# Patient Record
Sex: Female | Born: 1945 | Race: White | Hispanic: No | Marital: Single | Smoking: Former smoker
Health system: Southern US, Community
[De-identification: ages and names within clinical notes are randomized; demographics above are authoritative.]

## PROBLEM LIST (undated history)

## (undated) DIAGNOSIS — I10 Essential (primary) hypertension: Secondary | ICD-10-CM

---

## 1997-10-16 ENCOUNTER — Other Ambulatory Visit: Admission: RE | Admit: 1997-10-16 | Discharge: 1997-10-16 | Payer: Self-pay | Admitting: *Deleted

## 1998-12-02 ENCOUNTER — Other Ambulatory Visit: Admission: RE | Admit: 1998-12-02 | Discharge: 1998-12-02 | Payer: Self-pay | Admitting: *Deleted

## 1999-11-19 ENCOUNTER — Other Ambulatory Visit: Admission: RE | Admit: 1999-11-19 | Discharge: 1999-11-19 | Payer: Self-pay | Admitting: *Deleted

## 2000-12-28 ENCOUNTER — Other Ambulatory Visit: Admission: RE | Admit: 2000-12-28 | Discharge: 2000-12-28 | Payer: Self-pay | Admitting: *Deleted

## 2001-09-06 ENCOUNTER — Ambulatory Visit: Admission: RE | Admit: 2001-09-06 | Discharge: 2001-09-06 | Payer: Self-pay | Admitting: Orthopedic Surgery

## 2002-01-02 ENCOUNTER — Other Ambulatory Visit: Admission: RE | Admit: 2002-01-02 | Discharge: 2002-01-02 | Payer: Self-pay | Admitting: *Deleted

## 2003-01-08 ENCOUNTER — Other Ambulatory Visit: Admission: RE | Admit: 2003-01-08 | Discharge: 2003-01-08 | Payer: Self-pay | Admitting: *Deleted

## 2003-06-26 ENCOUNTER — Emergency Department (HOSPITAL_COMMUNITY): Admission: EM | Admit: 2003-06-26 | Discharge: 2003-06-26 | Payer: Self-pay | Admitting: Emergency Medicine

## 2004-01-10 ENCOUNTER — Other Ambulatory Visit: Admission: RE | Admit: 2004-01-10 | Discharge: 2004-01-10 | Payer: Self-pay | Admitting: Family Medicine

## 2005-04-20 ENCOUNTER — Other Ambulatory Visit: Admission: RE | Admit: 2005-04-20 | Discharge: 2005-04-20 | Payer: Self-pay | Admitting: Family Medicine

## 2005-06-02 ENCOUNTER — Emergency Department (HOSPITAL_COMMUNITY): Admission: EM | Admit: 2005-06-02 | Discharge: 2005-06-02 | Payer: Self-pay | Admitting: *Deleted

## 2006-06-10 ENCOUNTER — Other Ambulatory Visit: Admission: RE | Admit: 2006-06-10 | Discharge: 2006-06-10 | Payer: Self-pay | Admitting: Family Medicine

## 2006-06-22 ENCOUNTER — Encounter: Admission: RE | Admit: 2006-06-22 | Discharge: 2006-09-20 | Payer: Self-pay | Admitting: Family Medicine

## 2006-11-01 ENCOUNTER — Encounter: Admission: RE | Admit: 2006-11-01 | Discharge: 2006-11-16 | Payer: Self-pay | Admitting: Family Medicine

## 2009-07-17 ENCOUNTER — Other Ambulatory Visit: Admission: RE | Admit: 2009-07-17 | Discharge: 2009-07-17 | Payer: Self-pay | Admitting: Family Medicine

## 2009-09-18 ENCOUNTER — Ambulatory Visit: Payer: Self-pay | Admitting: Vascular Surgery

## 2009-12-20 ENCOUNTER — Ambulatory Visit: Payer: Self-pay | Admitting: Vascular Surgery

## 2010-02-12 ENCOUNTER — Ambulatory Visit: Payer: Self-pay | Admitting: Vascular Surgery

## 2010-02-19 ENCOUNTER — Ambulatory Visit: Payer: Self-pay | Admitting: Vascular Surgery

## 2010-03-19 ENCOUNTER — Ambulatory Visit: Payer: Self-pay | Admitting: Vascular Surgery

## 2010-03-26 ENCOUNTER — Ambulatory Visit: Payer: Self-pay | Admitting: Vascular Surgery

## 2010-09-23 NOTE — Assessment & Plan Note (Signed)
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  She has done quite well with her right leg as she did with  her left leg several weeks prior.  She does have the usual amount of  bruising and discomfort around the ablation site.  She underwent venous  duplex today and this shows thrombosis of her great saphenous vein from  the insertion site from the level of her knee to the great saphenous  vein and no evidence of DVT.  I am quite pleased with her result as is  Ms. Fuson.  She will notify us if she has difficulty.  Otherwise we  will see her again on an as-needed basis.     Larina Earthly, M.D.  Electronically Signed   TFE/MEDQ  D:  03/26/2010  T:  03/27/2010  Job:  [id number]

## 2010-09-23 NOTE — Assessment & Plan Note (Signed)
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  This was under  local anesthesia in our office without immediate complications.   She was discharged to home and will be seen again in 1 week for duplex.  She will have her stage of right leg following this.     Larina Earthly, M.D.  Electronically Signed   TFE/MEDQ  D:  02/12/2010  T:  02/13/2010  Job:  [id number]

## 2010-09-23 NOTE — Procedures (Signed)
DUPLEX DEEP VENOUS EXAM - LOWER EXTREMITY   INDICATION:  Followup, left greater saphenous vein laser ablation.   HISTORY:  Edema:  No.  Trauma/Surgery:  Left greater saphenous vein laser ablation on  02/12/2010.  Pain:  Left medial thigh tenderness.  PE:  No.  Previous DVT:  No.  Anticoagulants:  Other:   DUPLEX EXAM:                CFV   SFV   PopV  PTV    GSV                R  L  R  L  R  L  R   L  R  L  Thrombosis    o  o     o     o      o     +  Spontaneous   +  +     +     +      +     o  Phasic        +  +     +     +      +     o  Augmentation  +  +     +     +      +     o  Compressible  +  +     +     +      +     o  Competent     o  +     +     +      +     o   Legend:  + - yes  o - no  p - partial  D - decreased    IMPRESSION:  1. Totally occluded left greater saphenous vein extending from the      distal insertion site to the saphenofemoral junction.  2. No deep venous thrombosis noted in the left lower extremity.  3. Reflux noted in the right common femoral vein.  The left      saphenofemoral junction is competent.         _____________________________  Larina Earthly, M.D.   CH/MEDQ  D:  02/20/2010  T:  02/20/2010  Job:  [id number]

## 2010-09-23 NOTE — Procedures (Signed)
DUPLEX DEEP VENOUS EXAM - LOWER EXTREMITY   INDICATION:  Right follow-up ablation.   HISTORY:  Edema:  No.  Trauma/Surgery:  Right GSV ablation.  Pain:  Yes.  PE:  No.  Previous DVT:  No.  Anticoagulants:  no  Other:    DUPLEX EXAM:                CFV   SFV   PopV  PTV    GSV                R  L  R  L  R  L  R   L  R  L  Thrombosis    o  o  o     o     o      +  Spontaneous   +  +  +     +     +      0  Phasic        +  +  +     +     +      0  Augmentation  +  +  +     +     +      0  Compressible  +  +  +     +     +      0  Competent     0  +  +     +     +      +   Legend:  + - yes  o - no  p - partial  D - decreased   IMPRESSION:  No evidence of right lower extremity deep  venous thrombosis. Patent  left CFV without deep venous thrombosis.  Right GSV thrombosed from the SFJ to the distal insertion site .     _____________________________  Larina Earthly, M.D.   EM/MEDQ  D:  03/26/2010  T:  03/26/2010  Job:  [id number]

## 2010-09-23 NOTE — Assessment & Plan Note (Signed)
OFFICE VISIT   CHERYN, LUNDQUIST D  DOB:  Jan 27, 1946                                       12/20/2009  DGUYQ#:[id number]   Wendy Crosby presents today for continued follow-up of her bilateral  venous hypertension.  I had seen her initially in May.  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  She reports that prolonged  sitting especially when driving or car traveling is difficult due to leg  pain.  She has had to have decrease in her excise regimen due to leg  pain and swelling.  She also reports that squatting for gardening and  cleaning house are difficult due to leg pain.  I did re-image her  saphenous veins and she does have saphenous vein enlargement and  incompetence bilaterally feeding into these varicosities.  I feel that  she has failed conservative treatment and have recommended that we  proceed with laser ablation as staged treatment for both legs, laser  ablation and stab phlebectomy.  She does have worsening symptoms on the  left and therefore we will treat her left leg initially followed by her  right.  We will proceed with this at her earliest convenience.     Larina Earthly, M.D.  Electronically Signed   TFE/MEDQ  D:  12/20/2009  T:  12/23/2009  Job:  [id number]

## 2010-09-23 NOTE — Assessment & Plan Note (Signed)
OFFICE VISIT   Wendy Crosby, Wendy Crosby  DOB:  [birth year]                                       03/19/2010  MWNUU#:[id number]   Wendy Crosby presents today for a staged right great saphenous vein  ablation and stab phlebectomy for relief of symptoms of venous  hypertension.  This was done in our office under local anesthesia  without immediate complication.  She will be seen again in 1 week for a  repeat duplex.     Larina Earthly, M.Crosby.  Electronically Signed   TFE/MEDQ  Crosby:  03/19/2010  T:  03/20/2010  Job:  [id number]

## 2010-09-23 NOTE — Consult Note (Signed)
NEW PATIENT CONSULTATION   Wendy Crosby, Wendy Crosby  DOB:  October 18, 1945                                       09/18/2009  OZHYQ#:[id number]   This patient presents today for evaluation of bilateral painful  saphenous vein and tributary varicosities.  She is a 65 year old  otherwise healthy white female who reports progressive changes with  worsening venous varicosities since she was in her mid 47s.  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  She reports this has  been progressive over the course of many years.  She reports that it  makes long trips difficult.  She does have marked swelling with  prolonged standing in both legs from her knees distally.  She does not  have any history of DVT, superficial thrombophlebitis or bleeding.   PAST HISTORY:  Significant for hyperlipidemia, osteoporosis,  diverticulosis, and migraine headaches.  She has no significant cardiac  disease.   FAMILY HISTORY:  Markedly positive for varicose veins in her father and  paternal grandmother.  They had both had saphenous vein stripping many  years ago.   SOCIAL HISTORY:  She is single.  She is a semi-retired Engineer, site.  She quit smoking in 2006.  Does not drink alcohol.   REVIEW OF SYSTEMS:  Negative for weight loss or weight gain.  Her weight  is currently 150 pounds.  She is 5 feet 4 inches tall.  CARDIAC, PULMONARY, GI, GU:  Negative.  VASCULAR:  Positive only for pain with prolonged standing.  NEUROLOGIC:  Positive for headache.  MUSCULOSKELETAL:  Positive arthritis.  PSYCHIATRIC, ENT, HEMATOLOGIC, SKIN:  Negative.   PHYSICAL EXAMINATION:  A well-developed, well-nourished white female  appearing stated age of 80, in no acute distress.  Blood pressure 113/82  pulse 93, respirations 20.  HEENT:  Normal.  Chest;  Clear bilaterally.  Abdomen:  Soft with no masses.  Her radial and dorsalis  pedis pulses are  2+ bilaterally.  Musculoskeletal:  Shows no major deformities or  cyanosis.  Neurologic:  No focal weakness or paresthesias.  Skin:  Without ulcer or rashes.  She does have marked tributary varicosities in  both medial calves.  She has no venous ulcers.   She underwent noninvasive vascular laboratory studies in our office  today.  This showed no evidence of deep venous occlusion or reflux.  She  has marked reflux throughout her great saphenous vein bilaterally.  The  saphenous veins are not aneurysmal or tortuous.  Her small saphenous  veins are bilaterally.   I had a long discussion with the patient explaining the significance of  this.  She does have symptoms related to her venous hypertension.  She  does elevate her legs when possible and has worn support hose but has  not worn graduated compression garments.  We have fitted her with these  today, 20-30 mmHg, thigh-high compression garments.  We instructed her  on the use of these and will see her again in 3 months to determine if  she is having effective treatment.  I did discuss the option of staged  bilateral great saphenous vein laser ablation and stab phlebectomy of  her tributary varicosities if she fails conservative treatment.  We will  discuss this further with her in 3 months.  Larina Earthly, M.Crosby.  Electronically Signed   TFE/MEDQ  Crosby:  09/18/2009  T:  09/19/2009  Job:  [id number]   cc:   Sigmund Hazel, M.Crosby.

## 2010-09-23 NOTE — Procedures (Signed)
LOWER EXTREMITY VENOUS REFLUX EXAM   INDICATION:  Varicose veins, bilateral raised and painful.   EXAM:  Using color-flow imaging and pulse Doppler spectral analysis, the  right and left common femoral, superficial femoral, popliteal, posterior  tibial, greater and lesser saphenous veins are evaluated.  There is no  evidence suggesting deep venous insufficiency in the right and left  lower extremities.   The right and left saphenofemoral junction is not competent with Reflux  of >513milliseconds. The right and left GSV is not competent with Reflux  of >523milliseconds with the caliber as described below.   The right and left proximal short saphenous veins demonstrates  competency.   GSV Diameter (used if found to be incompetent only)                                            Right    Left  Proximal Greater Saphenous Vein           0.79 cm  0.86 cm  Proximal-to-mid-thigh                     0.56 cm  1.11 cm  Mid thigh                                 0.54 cm  1.06 cm  Mid-distal thigh                          0.60 cm  0.64 cm  Distal thigh                              0.54 cm  0.66 cm  Knee                                      0.55 cm  0.71 cm   IMPRESSION:  1. The right and left greater saphenous vein Reflux with      >517milliseconds is identified with the caliber ranging from on the      right 0.79 cm to 0.54 cm knee to groin.  On the left, 1.11 to 0.64      cm knee to groin.  2. The right and left greater saphenous veins are not aneurysmal.  3. The right and left greater saphenous veins are not tortuous.  4. The deep venous system is competent.  5. The right and left lesser saphenous veins are competent.         ___________________________________________  Larina Earthly, M.D.   NT/MEDQ  D:  09/18/2009  T:  09/18/2009  Job:  [id number]

## 2010-09-23 NOTE — Assessment & Plan Note (Signed)
OFFICE VISIT   SALSABEEL, GORELICK  DOB:  03-21-1946                                       02/19/2010  ZOXWR#:[id number]   The patient is here today for follow-up of her left leg great saphenous  vein ablation, stab phlebectomy, multiple tributary varicosities in her  left calf.  She did extremely well with minimal discomfort and minimal  bruising and has returned to her usual activities.  She has been very  compliant with her compression.  She underwent a repeat venous duplex  today.  This reveals ablation of her left great saphenous vein and no  injury of her common femoral vein.  I am quite pleased with her results.  We will see her again on November 9 where she will undergo similar  treatment in her right leg.     Larina Earthly, M.D.  Electronically Signed   TFE/MEDQ  D:  02/19/2010  T:  02/20/2010  Job:  [id number]   cc:   Sigmund Hazel, M.D.

## 2012-01-12 ENCOUNTER — Encounter (HOSPITAL_BASED_OUTPATIENT_CLINIC_OR_DEPARTMENT_OTHER): Payer: Medicare Other | Attending: General Surgery

## 2012-01-12 DIAGNOSIS — T22359A Burn of third degree of unspecified shoulder, initial encounter: Secondary | ICD-10-CM | POA: Insufficient documentation

## 2012-01-12 DIAGNOSIS — X31XXXA Exposure to excessive natural cold, initial encounter: Secondary | ICD-10-CM | POA: Insufficient documentation

## 2012-01-12 DIAGNOSIS — T3390XA Superficial frostbite of unspecified sites, initial encounter: Secondary | ICD-10-CM | POA: Insufficient documentation

## 2012-01-12 NOTE — H&P (Signed)
NAMEJENSEN, Wendy Crosby NO.:  [id number]  MEDICAL RECORD NO.:  [id number]  LOCATION:  FOOT                         FACILITY:  [facility]  PHYSICIAN:  Joanne Gavel, M.D.        DATE OF BIRTH:  14-May-1945  DATE OF ADMISSION:  01/12/2012 DATE OF DISCHARGE:                             HISTORY & PHYSICAL   CHIEF COMPLAINT:  Wound, left shoulder.  HISTORY OF PRESENT ILLNESS:  The patient has a rotator cuff abnormality with a great deal of left shoulder pain.  She was icing her left shoulder approximately 3 weeks ago and after icing, she noticed that she had a wound on her shoulder which eventuated in to a black patch.  PAST MEDICAL HISTORY:  Significant for hyperlipidemia, diverticulosis, osteopenia, varicose veins and hypertension.  SURGICAL HISTORY:  Saphenous vein excision and cataract surgery.  Cigarettes none.  Alcohol occasionally.  ALLERGIES:  None.  MEDICATIONS:  Hydrochlorothiazide, simvastatin, Mobic, Imitrex, and Systane.  REVIEW OF SYSTEMS:  Essentially as above.  PHYSICAL EXAMINATION:  VITAL SIGNS:  Temperature 98, pulse 96, respirations 16, blood pressure 130/88. GENERAL APPEARANCE:  Well developed, well nourished, no distress. HEENT:  Eyes, ears, nose and throat negative. SKIN:  On the left shoulder, there is a 2.5 x 3.5 full-thickness eschar. This is black and quite adherent. ABDOMEN:  Not examined. EXTREMITIES:  Not examined.  There is normal symmetrical strength in the upper extremities.  IMPRESSION:  Frostbite injury, left shoulder.  An attempt of excision was made, but the eschar is too adherent.  In the meantime we will treat the patient with Santyl and Hydrogel and we will see her in 7 days.     Joanne Gavel, M.D.     RA/MEDQ  D:  01/12/2012  T:  01/12/2012  Job:  [id number]  cc:   Dr. Hyacinth Meeker

## 2012-02-09 ENCOUNTER — Encounter (HOSPITAL_BASED_OUTPATIENT_CLINIC_OR_DEPARTMENT_OTHER): Payer: Medicare Other | Attending: General Surgery

## 2012-02-09 DIAGNOSIS — X31XXXA Exposure to excessive natural cold, initial encounter: Secondary | ICD-10-CM | POA: Insufficient documentation

## 2012-02-09 DIAGNOSIS — T22359A Burn of third degree of unspecified shoulder, initial encounter: Secondary | ICD-10-CM | POA: Insufficient documentation

## 2012-02-09 DIAGNOSIS — T3390XA Superficial frostbite of unspecified sites, initial encounter: Secondary | ICD-10-CM | POA: Insufficient documentation

## 2012-03-15 ENCOUNTER — Encounter (HOSPITAL_BASED_OUTPATIENT_CLINIC_OR_DEPARTMENT_OTHER): Payer: Medicare Other | Attending: General Surgery

## 2012-03-15 DIAGNOSIS — T3390XA Superficial frostbite of unspecified sites, initial encounter: Secondary | ICD-10-CM | POA: Insufficient documentation

## 2012-03-15 DIAGNOSIS — X31XXXA Exposure to excessive natural cold, initial encounter: Secondary | ICD-10-CM | POA: Insufficient documentation

## 2012-04-12 ENCOUNTER — Encounter (HOSPITAL_BASED_OUTPATIENT_CLINIC_OR_DEPARTMENT_OTHER): Payer: Medicare Other

## 2012-04-12 ENCOUNTER — Encounter (HOSPITAL_BASED_OUTPATIENT_CLINIC_OR_DEPARTMENT_OTHER): Payer: Medicare Other | Attending: General Surgery

## 2012-04-12 DIAGNOSIS — T22359A Burn of third degree of unspecified shoulder, initial encounter: Secondary | ICD-10-CM | POA: Insufficient documentation

## 2012-04-12 DIAGNOSIS — T310 Burns involving less than 10% of body surface: Secondary | ICD-10-CM | POA: Insufficient documentation

## 2012-04-12 DIAGNOSIS — X19XXXA Contact with other heat and hot substances, initial encounter: Secondary | ICD-10-CM | POA: Insufficient documentation

## 2012-07-25 ENCOUNTER — Other Ambulatory Visit: Payer: Self-pay | Admitting: Family Medicine

## 2012-07-25 DIAGNOSIS — R140 Abdominal distension (gaseous): Secondary | ICD-10-CM

## 2012-07-28 ENCOUNTER — Ambulatory Visit
Admission: RE | Admit: 2012-07-28 | Discharge: 2012-07-28 | Disposition: A | Discharge status: Home / Self Care | Payer: Medicare Other | Source: Ambulatory Visit | Attending: Family Medicine | Admitting: Family Medicine

## 2012-07-28 DIAGNOSIS — R140 Abdominal distension (gaseous): Secondary | ICD-10-CM

## 2012-12-08 ENCOUNTER — Other Ambulatory Visit: Payer: Self-pay | Admitting: Family Medicine

## 2013-01-10 ENCOUNTER — Other Ambulatory Visit: Payer: Self-pay | Admitting: Family Medicine

## 2013-01-10 DIAGNOSIS — Z0000 Encounter for general adult medical examination without abnormal findings: Secondary | ICD-10-CM

## 2013-01-12 ENCOUNTER — Ambulatory Visit
Admission: RE | Admit: 2013-01-12 | Discharge: 2013-01-12 | Disposition: A | Discharge status: Home / Self Care | Payer: Medicare Other | Source: Ambulatory Visit | Attending: Family Medicine | Admitting: Family Medicine

## 2013-01-12 DIAGNOSIS — Z0000 Encounter for general adult medical examination without abnormal findings: Secondary | ICD-10-CM

## 2014-01-26 IMAGING — US US TRANSVAGINAL NON-OB
1 series · 13 of 25 positions shown · non-contrast
Comparison: 07/28/2012

CLINICAL DATA: Follow-up left ovarian dermoid.  LMP 1441



[Series 1: us transvaginal non-ob · 0.24mm/px · 13 of 51 slices shown]
[im 1/51]
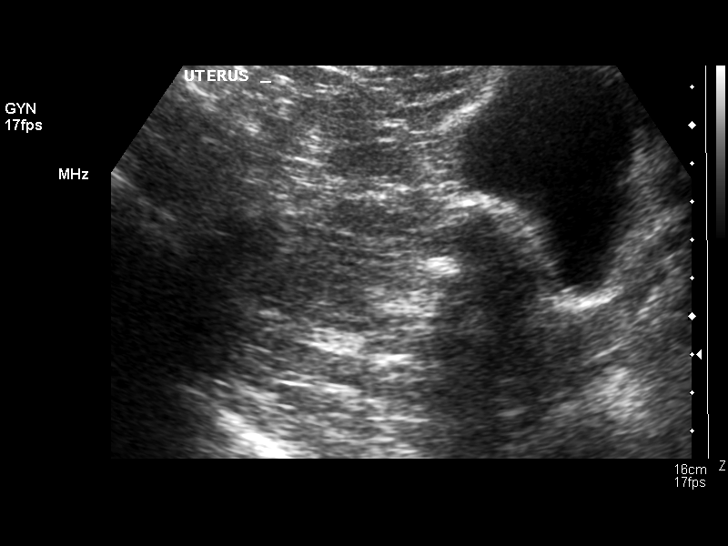
[im 5/51]
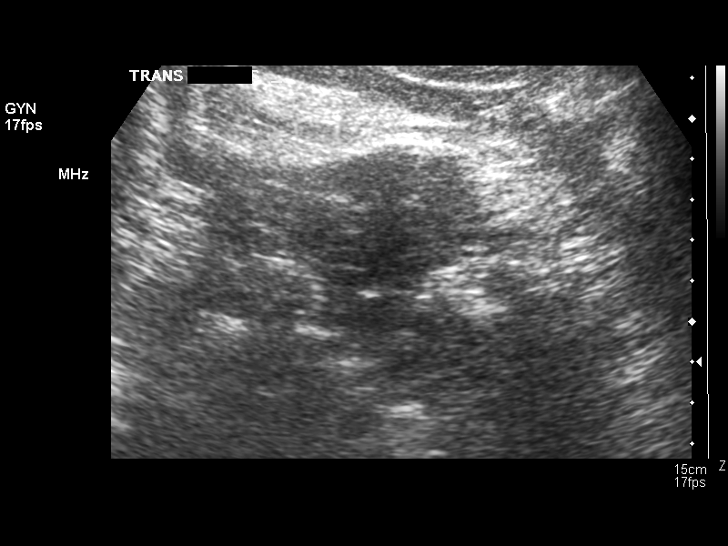
[im 9/51]
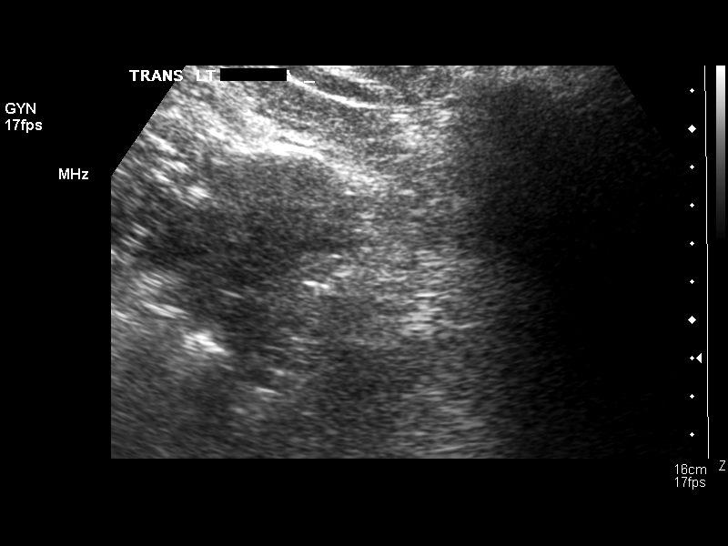
[im 13/51]
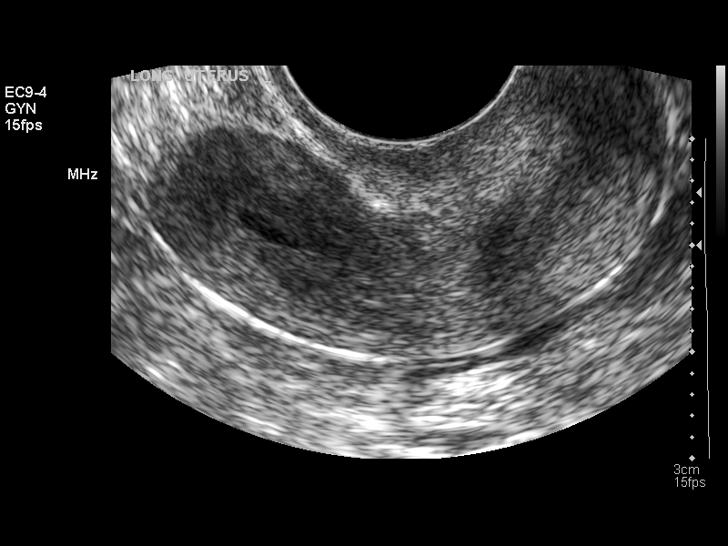
[im 17/51]
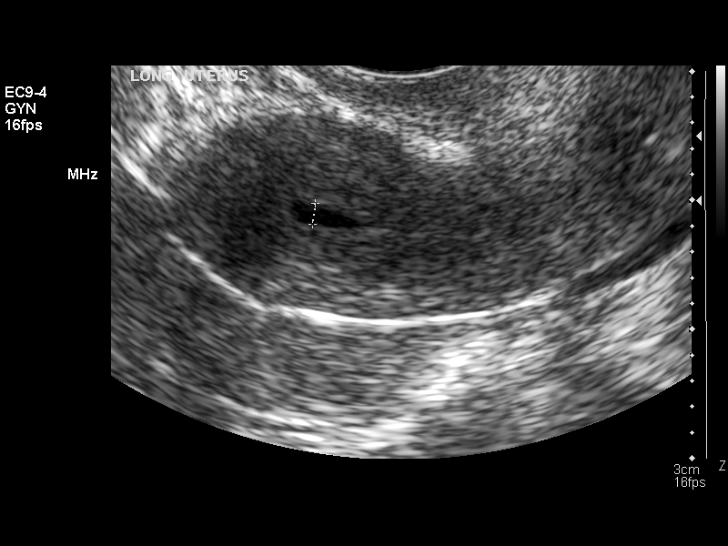
[im 21/51]
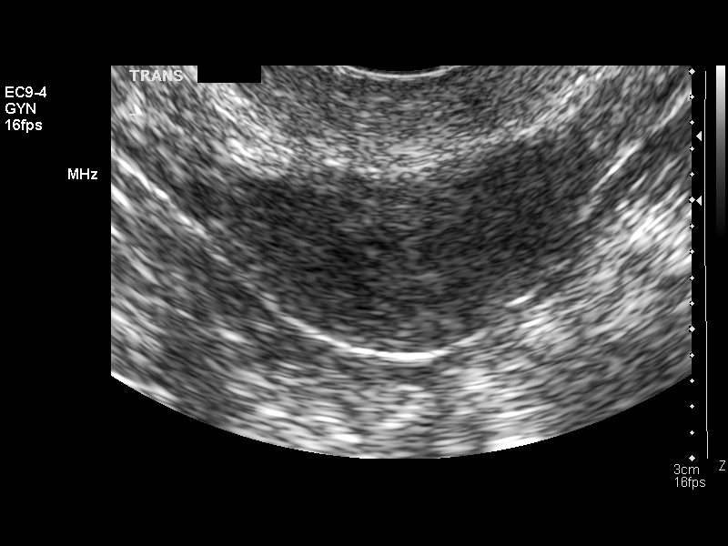
[im 26/51]
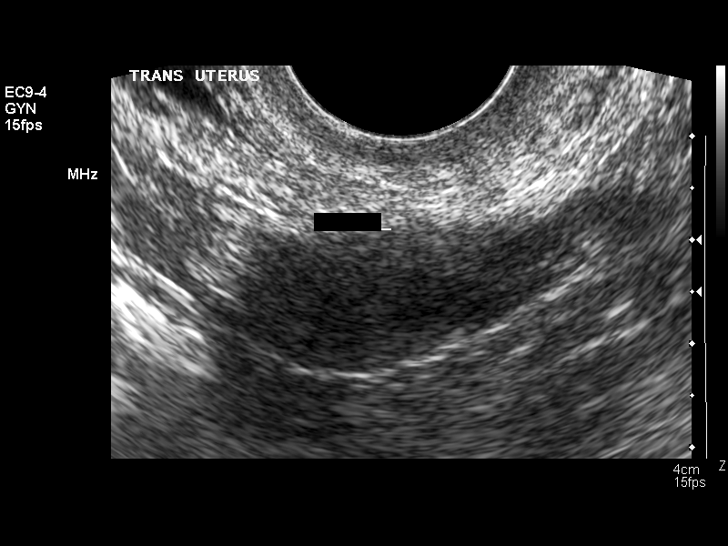
[im 30/51]
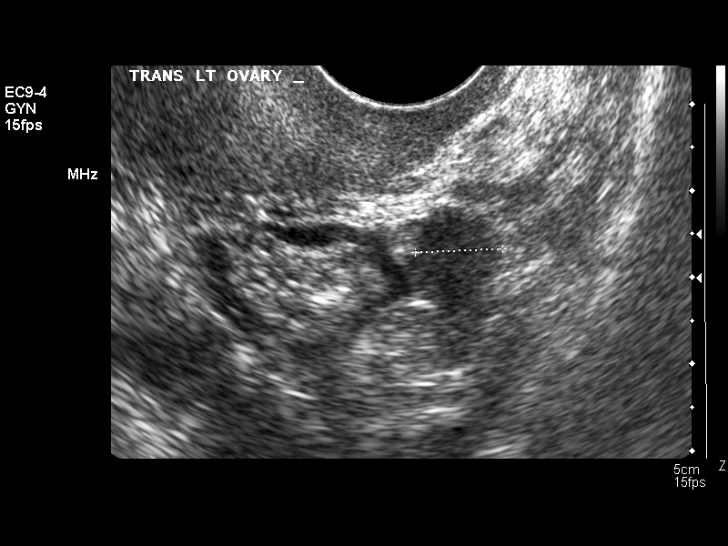
[im 34/51]
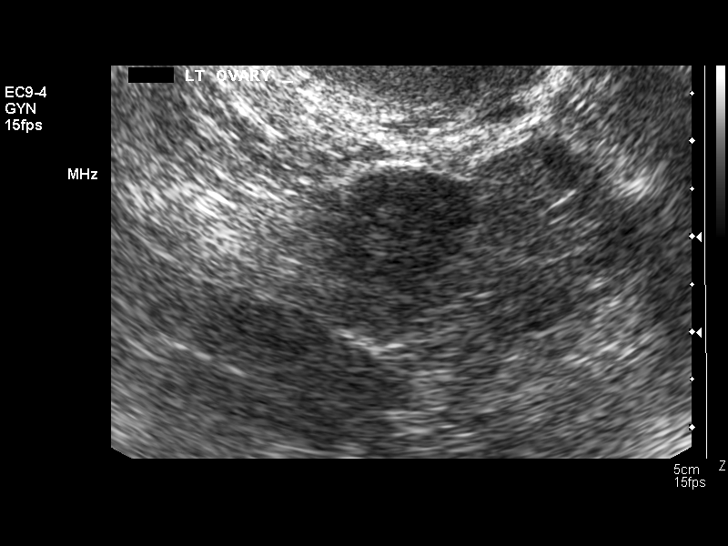
[im 38/51]
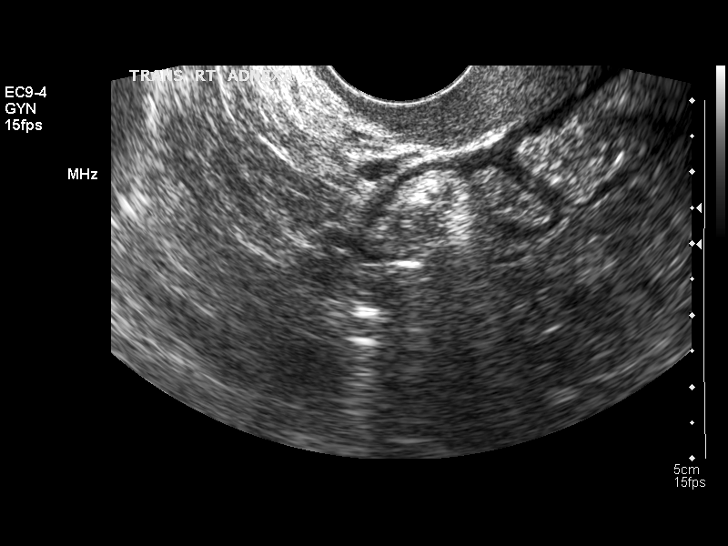
[im 42/51]
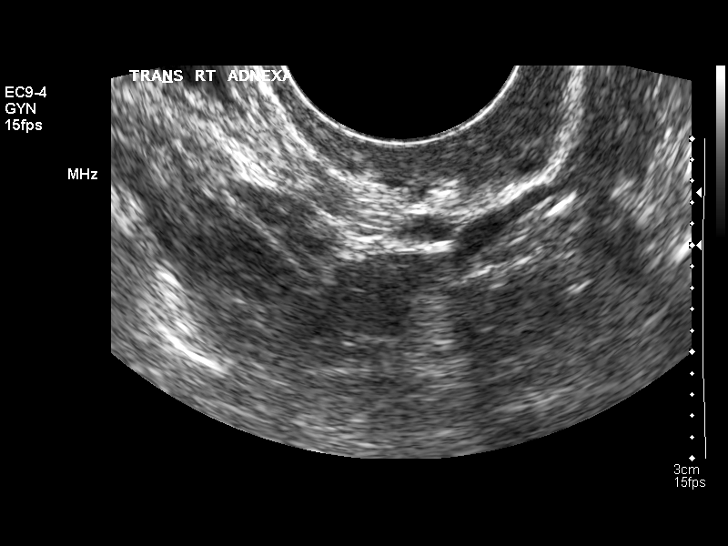
[im 46/51]
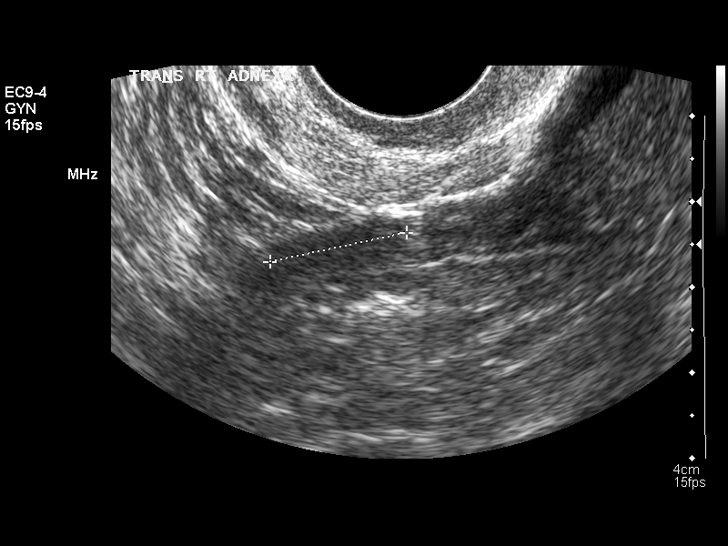
[im 51/51]
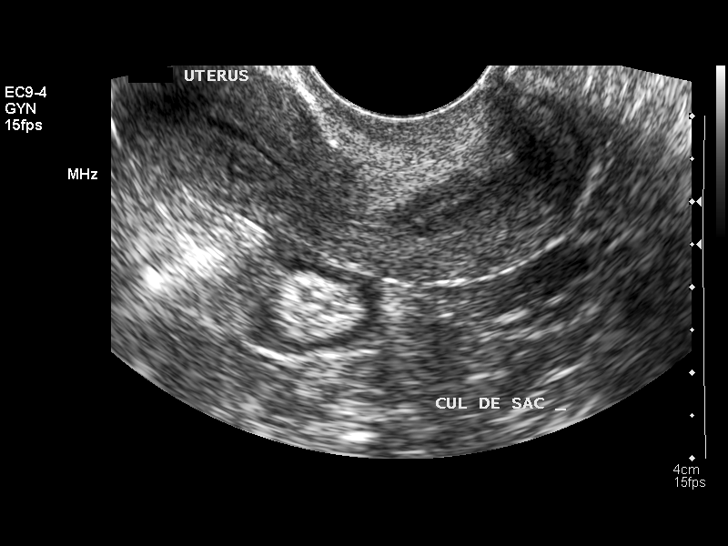

[13 of 25 positions shown; findings below may reference images not displayed]

FINDINGS: Uterus: Is anteverted and anteflexed and demonstrates a sagittal
length of 5.7 cm, depth of 1.6 cm and width of 3.4 cm.  No focal
mural abnormality is seen

Endometrium: A small amount of fluid is noted in the fundal portion
of the endometrium and within acceptable limits in a postmenopausal
patient.  This outlines a thin endometrium with a single layer
thickness of 8 mm and no areas of focal thickening or
heterogeneity.

Right ovary:  Has a normal appearance measuring 1.4 x 0.8 by
cm.  The previously noted benign appearing simple cyst is no longer
visualized compatible with interval resolution of a functional cyst

Left ovary: Measures 1.5 x 1.5 x 1.0 cm. The small area of
increased echogenicity in the left ovary seen on the previous exam
is no longer visualized and may have been artifactual or represents
a resolved benign process.  No further follow-up for this finding
is needed

Other findings: No pelvic fluid or separate adnexal masses are
identified.
IMPRESSION: Resolved right ovarian simple cyst and small left ovarian echogenic
focus compatible with prior benign findings.  No further follow-up
for these findings is needed.

Unremarkable uterine myometrium and endometrium..

## 2015-09-05 ENCOUNTER — Other Ambulatory Visit: Payer: Self-pay | Admitting: Gastroenterology

## 2019-06-03 ENCOUNTER — Ambulatory Visit: Payer: Medicare PPO | Attending: Internal Medicine

## 2019-06-03 DIAGNOSIS — Z23 Encounter for immunization: Secondary | ICD-10-CM | POA: Insufficient documentation

## 2019-06-03 NOTE — Progress Notes (Signed)
   Covid-19 Vaccination Clinic  Name:  [facility]    MRN: [id number] DOB: 11/04/1945  06/03/2019  Ms. Rotenberry was observed post Covid-19 immunization for 15 minutes without incidence. She was provided with Vaccine Information Sheet and instruction to access the V-Safe system.   Ms. Skolnik was instructed to call 911 with any severe reactions post vaccine: Marland Kitchen Difficulty breathing  . Swelling of your face and throat  . A fast heartbeat  . A bad rash all over your body  . Dizziness and weakness    Immunizations Administered    Name Date Dose VIS Date Route   Pfizer COVID-19 Vaccine 06/03/2019  2:26 PM 0.3 mL 04/21/2019 Intramuscular   Manufacturer: [employer], [employer]   Lot: [id number]   NDC: [address]-2

## 2019-06-07 ENCOUNTER — Ambulatory Visit: Payer: Medicare PPO

## 2019-06-09 ENCOUNTER — Ambulatory Visit: Payer: Medicare PPO

## 2019-06-24 ENCOUNTER — Ambulatory Visit: Payer: Medicare PPO

## 2019-06-25 ENCOUNTER — Ambulatory Visit: Payer: Medicare PPO

## 2019-06-25 ENCOUNTER — Ambulatory Visit: Payer: Medicare PPO | Attending: Internal Medicine

## 2019-06-25 DIAGNOSIS — Z23 Encounter for immunization: Secondary | ICD-10-CM

## 2019-06-25 NOTE — Progress Notes (Signed)
   Covid-19 Vaccination Clinic  Name:  Wendy Crosby    MRN: [id number] DOB: 1945/11/14  06/25/2019  Wendy Crosby was observed post Covid-19 immunization for 15 minutes without incidence. She was provided with Vaccine Information Sheet and instruction to access the V-Safe system.   Wendy Crosby was instructed to call 911 with any severe reactions post vaccine: Marland Kitchen Difficulty breathing  . Swelling of your face and throat  . A fast heartbeat  . A bad rash all over your body  . Dizziness and weakness    Immunizations Administered    Name Date Dose VIS Date Route   Pfizer COVID-19 Vaccine 06/25/2019 12:44 PM 0.3 mL 04/21/2019 Intramuscular   Manufacturer: [employer], [employer]   Lot: [id number]   NDC: [address]-4

## 2019-07-09 ENCOUNTER — Ambulatory Visit: Payer: Self-pay

## 2019-08-23 DIAGNOSIS — L719 Rosacea, unspecified: Secondary | ICD-10-CM | POA: Diagnosis not present

## 2019-08-23 DIAGNOSIS — E7800 Pure hypercholesterolemia, unspecified: Secondary | ICD-10-CM | POA: Diagnosis not present

## 2019-08-23 DIAGNOSIS — Z0000 Encounter for general adult medical examination without abnormal findings: Secondary | ICD-10-CM | POA: Diagnosis not present

## 2019-08-23 DIAGNOSIS — G43009 Migraine without aura, not intractable, without status migrainosus: Secondary | ICD-10-CM | POA: Diagnosis not present

## 2019-08-23 DIAGNOSIS — M533 Sacrococcygeal disorders, not elsewhere classified: Secondary | ICD-10-CM | POA: Diagnosis not present

## 2019-08-23 DIAGNOSIS — Z79899 Other long term (current) drug therapy: Secondary | ICD-10-CM | POA: Diagnosis not present

## 2019-08-23 DIAGNOSIS — Z6825 Body mass index (BMI) 25.0-25.9, adult: Secondary | ICD-10-CM | POA: Diagnosis not present

## 2019-08-23 DIAGNOSIS — B351 Tinea unguium: Secondary | ICD-10-CM | POA: Diagnosis not present

## 2019-10-11 DIAGNOSIS — L814 Other melanin hyperpigmentation: Secondary | ICD-10-CM | POA: Diagnosis not present

## 2019-10-11 DIAGNOSIS — Z86018 Personal history of other benign neoplasm: Secondary | ICD-10-CM | POA: Diagnosis not present

## 2019-10-11 DIAGNOSIS — L570 Actinic keratosis: Secondary | ICD-10-CM | POA: Diagnosis not present

## 2019-10-11 DIAGNOSIS — L578 Other skin changes due to chronic exposure to nonionizing radiation: Secondary | ICD-10-CM | POA: Diagnosis not present

## 2019-10-11 DIAGNOSIS — L905 Scar conditions and fibrosis of skin: Secondary | ICD-10-CM | POA: Diagnosis not present

## 2019-10-11 DIAGNOSIS — L821 Other seborrheic keratosis: Secondary | ICD-10-CM | POA: Diagnosis not present

## 2019-10-11 DIAGNOSIS — D225 Melanocytic nevi of trunk: Secondary | ICD-10-CM | POA: Diagnosis not present

## 2019-10-24 DIAGNOSIS — Z961 Presence of intraocular lens: Secondary | ICD-10-CM | POA: Diagnosis not present

## 2019-12-29 DIAGNOSIS — Z1231 Encounter for screening mammogram for malignant neoplasm of breast: Secondary | ICD-10-CM | POA: Diagnosis not present

## 2020-01-19 DIAGNOSIS — E7800 Pure hypercholesterolemia, unspecified: Secondary | ICD-10-CM | POA: Diagnosis not present

## 2020-08-26 DIAGNOSIS — Z23 Encounter for immunization: Secondary | ICD-10-CM | POA: Diagnosis not present

## 2020-08-26 DIAGNOSIS — Z0000 Encounter for general adult medical examination without abnormal findings: Secondary | ICD-10-CM | POA: Diagnosis not present

## 2020-08-26 DIAGNOSIS — Z1389 Encounter for screening for other disorder: Secondary | ICD-10-CM | POA: Diagnosis not present

## 2020-10-10 DIAGNOSIS — L578 Other skin changes due to chronic exposure to nonionizing radiation: Secondary | ICD-10-CM | POA: Diagnosis not present

## 2020-10-10 DIAGNOSIS — D225 Melanocytic nevi of trunk: Secondary | ICD-10-CM | POA: Diagnosis not present

## 2020-10-10 DIAGNOSIS — Z86018 Personal history of other benign neoplasm: Secondary | ICD-10-CM | POA: Diagnosis not present

## 2020-10-10 DIAGNOSIS — L905 Scar conditions and fibrosis of skin: Secondary | ICD-10-CM | POA: Diagnosis not present

## 2020-10-10 DIAGNOSIS — L821 Other seborrheic keratosis: Secondary | ICD-10-CM | POA: Diagnosis not present

## 2020-10-10 DIAGNOSIS — L814 Other melanin hyperpigmentation: Secondary | ICD-10-CM | POA: Diagnosis not present

## 2020-10-10 DIAGNOSIS — L570 Actinic keratosis: Secondary | ICD-10-CM | POA: Diagnosis not present

## 2020-10-30 DIAGNOSIS — G43009 Migraine without aura, not intractable, without status migrainosus: Secondary | ICD-10-CM | POA: Diagnosis not present

## 2020-10-30 DIAGNOSIS — L719 Rosacea, unspecified: Secondary | ICD-10-CM | POA: Diagnosis not present

## 2020-10-30 DIAGNOSIS — Z79899 Other long term (current) drug therapy: Secondary | ICD-10-CM | POA: Diagnosis not present

## 2020-10-30 DIAGNOSIS — E7800 Pure hypercholesterolemia, unspecified: Secondary | ICD-10-CM | POA: Diagnosis not present

## 2020-10-30 DIAGNOSIS — M859 Disorder of bone density and structure, unspecified: Secondary | ICD-10-CM | POA: Diagnosis not present

## 2020-11-01 DIAGNOSIS — H4720 Unspecified optic atrophy: Secondary | ICD-10-CM | POA: Diagnosis not present

## 2020-11-01 DIAGNOSIS — Z961 Presence of intraocular lens: Secondary | ICD-10-CM | POA: Diagnosis not present

## 2020-11-01 DIAGNOSIS — H524 Presbyopia: Secondary | ICD-10-CM | POA: Diagnosis not present

## 2020-12-30 DIAGNOSIS — Z1231 Encounter for screening mammogram for malignant neoplasm of breast: Secondary | ICD-10-CM | POA: Diagnosis not present

## 2020-12-30 DIAGNOSIS — M810 Age-related osteoporosis without current pathological fracture: Secondary | ICD-10-CM | POA: Diagnosis not present

## 2020-12-30 DIAGNOSIS — Z780 Asymptomatic menopausal state: Secondary | ICD-10-CM | POA: Diagnosis not present

## 2020-12-30 DIAGNOSIS — M8589 Other specified disorders of bone density and structure, multiple sites: Secondary | ICD-10-CM | POA: Diagnosis not present

## 2021-09-02 DIAGNOSIS — Z0000 Encounter for general adult medical examination without abnormal findings: Secondary | ICD-10-CM | POA: Diagnosis not present

## 2021-09-02 DIAGNOSIS — Z1389 Encounter for screening for other disorder: Secondary | ICD-10-CM | POA: Diagnosis not present

## 2021-09-05 DIAGNOSIS — I10 Essential (primary) hypertension: Secondary | ICD-10-CM | POA: Diagnosis not present

## 2021-10-14 DIAGNOSIS — L821 Other seborrheic keratosis: Secondary | ICD-10-CM | POA: Diagnosis not present

## 2021-10-14 DIAGNOSIS — L814 Other melanin hyperpigmentation: Secondary | ICD-10-CM | POA: Diagnosis not present

## 2021-10-14 DIAGNOSIS — Z86018 Personal history of other benign neoplasm: Secondary | ICD-10-CM | POA: Diagnosis not present

## 2021-10-14 DIAGNOSIS — L570 Actinic keratosis: Secondary | ICD-10-CM | POA: Diagnosis not present

## 2021-10-14 DIAGNOSIS — L905 Scar conditions and fibrosis of skin: Secondary | ICD-10-CM | POA: Diagnosis not present

## 2021-10-14 DIAGNOSIS — D225 Melanocytic nevi of trunk: Secondary | ICD-10-CM | POA: Diagnosis not present

## 2021-10-14 DIAGNOSIS — L578 Other skin changes due to chronic exposure to nonionizing radiation: Secondary | ICD-10-CM | POA: Diagnosis not present

## 2021-11-05 DIAGNOSIS — Z23 Encounter for immunization: Secondary | ICD-10-CM | POA: Diagnosis not present

## 2021-11-05 DIAGNOSIS — M810 Age-related osteoporosis without current pathological fracture: Secondary | ICD-10-CM | POA: Diagnosis not present

## 2021-11-05 DIAGNOSIS — L719 Rosacea, unspecified: Secondary | ICD-10-CM | POA: Diagnosis not present

## 2021-11-05 DIAGNOSIS — I10 Essential (primary) hypertension: Secondary | ICD-10-CM | POA: Diagnosis not present

## 2021-11-05 DIAGNOSIS — E7800 Pure hypercholesterolemia, unspecified: Secondary | ICD-10-CM | POA: Diagnosis not present

## 2021-11-07 DIAGNOSIS — Z961 Presence of intraocular lens: Secondary | ICD-10-CM | POA: Diagnosis not present

## 2021-11-07 DIAGNOSIS — H4720 Unspecified optic atrophy: Secondary | ICD-10-CM | POA: Diagnosis not present

## 2022-01-06 DIAGNOSIS — Z1231 Encounter for screening mammogram for malignant neoplasm of breast: Secondary | ICD-10-CM | POA: Diagnosis not present

## 2022-06-14 DIAGNOSIS — N390 Urinary tract infection, site not specified: Secondary | ICD-10-CM | POA: Diagnosis not present

## 2022-09-07 DIAGNOSIS — Z1389 Encounter for screening for other disorder: Secondary | ICD-10-CM | POA: Diagnosis not present

## 2022-09-07 DIAGNOSIS — Z6824 Body mass index (BMI) 24.0-24.9, adult: Secondary | ICD-10-CM | POA: Diagnosis not present

## 2022-09-07 DIAGNOSIS — Z0000 Encounter for general adult medical examination without abnormal findings: Secondary | ICD-10-CM | POA: Diagnosis not present

## 2022-10-27 DIAGNOSIS — Z86018 Personal history of other benign neoplasm: Secondary | ICD-10-CM | POA: Diagnosis not present

## 2022-10-27 DIAGNOSIS — D225 Melanocytic nevi of trunk: Secondary | ICD-10-CM | POA: Diagnosis not present

## 2022-10-27 DIAGNOSIS — L578 Other skin changes due to chronic exposure to nonionizing radiation: Secondary | ICD-10-CM | POA: Diagnosis not present

## 2022-10-27 DIAGNOSIS — L821 Other seborrheic keratosis: Secondary | ICD-10-CM | POA: Diagnosis not present

## 2022-10-27 DIAGNOSIS — L570 Actinic keratosis: Secondary | ICD-10-CM | POA: Diagnosis not present

## 2022-10-27 DIAGNOSIS — L814 Other melanin hyperpigmentation: Secondary | ICD-10-CM | POA: Diagnosis not present

## 2022-10-27 DIAGNOSIS — L905 Scar conditions and fibrosis of skin: Secondary | ICD-10-CM | POA: Diagnosis not present

## 2022-12-04 DIAGNOSIS — I10 Essential (primary) hypertension: Secondary | ICD-10-CM | POA: Diagnosis not present

## 2022-12-04 DIAGNOSIS — M533 Sacrococcygeal disorders, not elsewhere classified: Secondary | ICD-10-CM | POA: Diagnosis not present

## 2022-12-04 DIAGNOSIS — E7800 Pure hypercholesterolemia, unspecified: Secondary | ICD-10-CM | POA: Diagnosis not present

## 2022-12-04 DIAGNOSIS — M810 Age-related osteoporosis without current pathological fracture: Secondary | ICD-10-CM | POA: Diagnosis not present

## 2023-01-19 DIAGNOSIS — M8588 Other specified disorders of bone density and structure, other site: Secondary | ICD-10-CM | POA: Diagnosis not present

## 2023-01-19 DIAGNOSIS — Z1231 Encounter for screening mammogram for malignant neoplasm of breast: Secondary | ICD-10-CM | POA: Diagnosis not present

## 2023-02-12 DIAGNOSIS — S0990XA Unspecified injury of head, initial encounter: Secondary | ICD-10-CM | POA: Diagnosis not present

## 2023-02-12 DIAGNOSIS — S0993XA Unspecified injury of face, initial encounter: Secondary | ICD-10-CM | POA: Diagnosis not present

## 2023-02-25 DIAGNOSIS — H5709 Other anomalies of pupillary function: Secondary | ICD-10-CM | POA: Diagnosis not present

## 2023-02-25 DIAGNOSIS — Z961 Presence of intraocular lens: Secondary | ICD-10-CM | POA: Diagnosis not present

## 2023-02-25 DIAGNOSIS — H524 Presbyopia: Secondary | ICD-10-CM | POA: Diagnosis not present

## 2023-06-07 DIAGNOSIS — H5709 Other anomalies of pupillary function: Secondary | ICD-10-CM | POA: Diagnosis not present

## 2023-09-08 DIAGNOSIS — Z0000 Encounter for general adult medical examination without abnormal findings: Secondary | ICD-10-CM | POA: Diagnosis not present

## 2023-09-08 DIAGNOSIS — Z6824 Body mass index (BMI) 24.0-24.9, adult: Secondary | ICD-10-CM | POA: Diagnosis not present

## 2023-10-25 DIAGNOSIS — L814 Other melanin hyperpigmentation: Secondary | ICD-10-CM | POA: Diagnosis not present

## 2023-10-25 DIAGNOSIS — L578 Other skin changes due to chronic exposure to nonionizing radiation: Secondary | ICD-10-CM | POA: Diagnosis not present

## 2023-10-25 DIAGNOSIS — L570 Actinic keratosis: Secondary | ICD-10-CM | POA: Diagnosis not present

## 2023-10-25 DIAGNOSIS — D225 Melanocytic nevi of trunk: Secondary | ICD-10-CM | POA: Diagnosis not present

## 2023-10-25 DIAGNOSIS — L905 Scar conditions and fibrosis of skin: Secondary | ICD-10-CM | POA: Diagnosis not present

## 2023-10-25 DIAGNOSIS — L821 Other seborrheic keratosis: Secondary | ICD-10-CM | POA: Diagnosis not present

## 2023-10-25 DIAGNOSIS — Z86018 Personal history of other benign neoplasm: Secondary | ICD-10-CM | POA: Diagnosis not present

## 2023-11-02 ENCOUNTER — Emergency Department (HOSPITAL_COMMUNITY)
Admission: EM | Admit: 2023-11-02 | Discharge: 2023-11-02 | Disposition: A | Discharge status: Home / Self Care | Attending: Emergency Medicine | Admitting: Emergency Medicine

## 2023-11-02 ENCOUNTER — Emergency Department (HOSPITAL_COMMUNITY)

## 2023-11-02 ENCOUNTER — Other Ambulatory Visit: Payer: Self-pay

## 2023-11-02 ENCOUNTER — Encounter (HOSPITAL_COMMUNITY): Payer: Self-pay

## 2023-11-02 DIAGNOSIS — R197 Diarrhea, unspecified: Secondary | ICD-10-CM | POA: Diagnosis not present

## 2023-11-02 DIAGNOSIS — K5792 Diverticulitis of intestine, part unspecified, without perforation or abscess without bleeding: Secondary | ICD-10-CM | POA: Insufficient documentation

## 2023-11-02 DIAGNOSIS — I10 Essential (primary) hypertension: Secondary | ICD-10-CM | POA: Insufficient documentation

## 2023-11-02 DIAGNOSIS — K7689 Other specified diseases of liver: Secondary | ICD-10-CM | POA: Diagnosis not present

## 2023-11-02 DIAGNOSIS — R300 Dysuria: Secondary | ICD-10-CM | POA: Diagnosis not present

## 2023-11-02 DIAGNOSIS — K5732 Diverticulitis of large intestine without perforation or abscess without bleeding: Secondary | ICD-10-CM | POA: Diagnosis not present

## 2023-11-02 DIAGNOSIS — R1032 Left lower quadrant pain: Secondary | ICD-10-CM | POA: Diagnosis not present

## 2023-11-02 HISTORY — DX: Essential (primary) hypertension: I10

## 2023-11-02 LAB — COMPREHENSIVE METABOLIC PANEL WITH GFR
ALT: 32 U/L (ref 0–44)
AST: 24 U/L (ref 15–41)
Albumin: 3.8 g/dL (ref 3.5–5.0)
Alkaline Phosphatase: 59 U/L (ref 38–126)
Anion gap: 13 (ref 5–15)
BUN: 15 mg/dL (ref 8–23)
CO2: 24 mmol/L (ref 22–32)
Calcium: 8.9 mg/dL (ref 8.9–10.3)
Chloride: 99 mmol/L (ref 98–111)
Creatinine, Ser: 0.73 mg/dL (ref 0.44–1.00)
GFR, Estimated: >60 mL/min (ref >60)
Glucose, Bld: 108 mg/dL — ABNORMAL HIGH (ref 70–99)
Potassium: 4.1 mmol/L (ref 3.5–5.1)
Sodium: 136 mmol/L (ref 135–145)
Total Bilirubin: 2.9 mg/dL — ABNORMAL HIGH (ref 0.0–1.2)
Total Protein: 6.9 g/dL (ref 6.5–8.1)

## 2023-11-02 LAB — URINALYSIS, W/ REFLEX TO CULTURE (INFECTION SUSPECTED)
Bilirubin Urine: NEGATIVE
Glucose, UA: NEGATIVE mg/dL
Hgb urine dipstick: NEGATIVE
Ketones, ur: NEGATIVE mg/dL
Nitrite: NEGATIVE
Protein, ur: NEGATIVE mg/dL
Specific Gravity, Urine: 1.013 (ref 1.005–1.030)
pH: 6 (ref 5.0–8.0)

## 2023-11-02 LAB — CBC WITH DIFFERENTIAL/PLATELET
Abs Immature Granulocytes: 0.03 10*3/uL (ref 0.00–0.07)
Basophils Absolute: 0 10*3/uL (ref 0.0–0.1)
Basophils Relative: 1 %
Eosinophils Absolute: 0 10*3/uL (ref 0.0–0.5)
Eosinophils Relative: 0 %
HCT: 39.8 % (ref 36.0–46.0)
Hemoglobin: 13.5 g/dL (ref 12.0–15.0)
Immature Granulocytes: 0 %
Lymphocytes Relative: 14 %
Lymphs Abs: 1.1 10*3/uL (ref 0.7–4.0)
MCH: 33.3 pg (ref 26.0–34.0)
MCHC: 33.9 g/dL (ref 30.0–36.0)
MCV: 98 fL (ref 80.0–100.0)
Monocytes Absolute: 0.7 10*3/uL (ref 0.1–1.0)
Monocytes Relative: 8 %
Neutro Abs: 6.2 10*3/uL (ref 1.7–7.7)
Neutrophils Relative %: 77 %
Platelets: 148 10*3/uL — ABNORMAL LOW (ref 150–400)
RBC: 4.06 MIL/uL (ref 3.87–5.11)
RDW: 11.1 % — ABNORMAL LOW (ref 11.5–15.5)
WBC: 8.1 10*3/uL (ref 4.0–10.5)
nRBC: 0 % (ref 0.0–0.2)

## 2023-11-02 LAB — I-STAT CG4 LACTIC ACID, ED: Lactic Acid, Venous: 1.1 mmol/L (ref 0.5–1.9)

## 2023-11-02 LAB — LIPASE, BLOOD: Lipase: 41 U/L (ref 11–51)

## 2023-11-02 MED ORDER — AMOXICILLIN-POT CLAVULANATE 875-125 MG PO TABS
1.0000 | ORAL_TABLET | Freq: Once | ORAL | Status: AC
  Administered 2023-11-02: 1 via ORAL
  Filled 2023-11-02: qty 1

## 2023-11-02 MED ORDER — HYDROCODONE-ACETAMINOPHEN 5-325 MG PO TABS: 1.0000 | ORAL_TABLET | ORAL | 0 refills | Status: AC | PRN

## 2023-11-02 MED ORDER — IOHEXOL 300 MG/ML  SOLN
100.0000 mL | Freq: Once | INTRAMUSCULAR | Status: AC | PRN
  Administered 2023-11-02: 100 mL via INTRAVENOUS

## 2023-11-02 MED ORDER — AMOXICILLIN-POT CLAVULANATE 875-125 MG PO TABS: 1.0000 | ORAL_TABLET | Freq: Two times a day (BID) | ORAL | 0 refills | Status: AC

## 2023-11-02 MED ORDER — ONDANSETRON 4 MG PO TBDP: 4.0000 mg | ORAL_TABLET | Freq: Three times a day (TID) | ORAL | 0 refills | Status: AC | PRN

## 2023-11-02 MED ORDER — SODIUM CHLORIDE (PF) 0.9 % IJ SOLN
INTRAMUSCULAR | Status: AC
  Filled 2023-11-02: qty 50

## 2023-11-02 NOTE — ED Provider Notes (Signed)
  EMERGENCY DEPARTMENT AT [facility] Provider Note   CSN: [id number] Arrival date & time: 11/02/23  9151     Patient presents with: Abdominal Pain   Wendy Crosby is a 78 y.o. female.   The history is provided by the patient and medical records (paper from PCP). No language interpreter was used.  Abdominal Pain Pain location:  LLQ Pain quality: aching and cramping   Pain radiates to:  Does not radiate Pain severity:  Moderate Onset quality:  Gradual Duration:  3 days Timing:  Constant Progression:  Waxing and waning Chronicity:  New Context: not previous surgeries, not recent illness, not sick contacts and not trauma   Relieved by:  Acetaminophen Worsened by:  Palpation Ineffective treatments:  None tried Associated symptoms: diarrhea and dysuria   Associated symptoms: no chest pain, no chills, no constipation, no cough, no fatigue, no fever, no nausea, no shortness of breath, no vaginal bleeding, no vaginal discharge and no vomiting   Risk factors: has not had multiple surgeries        Prior to Admission medications   Not on File    Allergies: Patient has no allergy information on record.    Review of Systems  Constitutional:  Negative for chills, fatigue and fever.  HENT:  Negative for congestion.   Respiratory:  Negative for cough, chest tightness and shortness of breath.   Cardiovascular:  Negative for chest pain and leg swelling.  Gastrointestinal:  Positive for abdominal pain and diarrhea. Negative for abdominal distention, constipation, nausea and vomiting.  Genitourinary:  Positive for dysuria. Negative for flank pain, frequency, vaginal bleeding and vaginal discharge.  Musculoskeletal:  Negative for back pain and neck pain.  Skin:  Negative for rash and wound.  Neurological:  Negative for light-headedness and headaches.  Psychiatric/Behavioral:  Negative for agitation and confusion.   All other systems reviewed and are  negative.   Updated Vital Signs BP 118/71 (BP Location: Right Arm)   Pulse 72   Temp 98 F (36.7 C) (Oral)   Resp 15   Ht 5' 3 (1.6 m)   Wt 64 kg   SpO2 98%   BMI 24.98 kg/m   Physical Exam Vitals and nursing note reviewed.  Constitutional:      General: She is not in acute distress.    Appearance: She is well-developed. She is not ill-appearing, toxic-appearing or diaphoretic.  HENT:     Head: Normocephalic and atraumatic.     Mouth/Throat:     Mouth: Mucous membranes are moist.   Eyes:     Conjunctiva/sclera: Conjunctivae normal.    Cardiovascular:     Rate and Rhythm: Normal rate and regular rhythm.     Heart sounds: No murmur heard. Pulmonary:     Effort: Pulmonary effort is normal. No respiratory distress.     Breath sounds: Normal breath sounds.  Abdominal:     General: Abdomen is flat. Bowel sounds are normal.     Palpations: Abdomen is soft.     Tenderness: There is abdominal tenderness in the suprapubic area and left lower quadrant. There is no right CVA tenderness, left CVA tenderness, guarding or rebound.   Musculoskeletal:        General: No swelling.     Cervical back: Neck supple.   Skin:    General: Skin is warm and dry.     Capillary Refill: Capillary refill takes less than 2 seconds.   Neurological:     Mental Status: She  is alert.   Psychiatric:        Mood and Affect: Mood normal.     (all labs ordered are listed, but only abnormal results are displayed) Labs Reviewed  CBC WITH DIFFERENTIAL/PLATELET - Abnormal; Notable for the following components:      Result Value   RDW 11.1 (*)    Platelets 148 (*)    All other components within normal limits  COMPREHENSIVE METABOLIC PANEL WITH GFR - Abnormal; Notable for the following components:   Glucose, Bld 108 (*)    Total Bilirubin 2.9 (*)    All other components within normal limits  URINALYSIS, W/ REFLEX TO CULTURE (INFECTION SUSPECTED) - Abnormal; Notable for the following components:    Color, Urine STRAW (*)    Leukocytes,Ua MODERATE (*)    Bacteria, UA RARE (*)    All other components within normal limits  URINE CULTURE  LIPASE, BLOOD  I-STAT CG4 LACTIC ACID, ED    EKG: None  Radiology: CT ABDOMEN PELVIS W CONTRAST Result Date: 11/02/2023 CLINICAL DATA:  Left lower quadrant abdominal pain. EXAM: CT ABDOMEN AND PELVIS WITH CONTRAST TECHNIQUE: Multidetector CT imaging of the abdomen and pelvis was performed using the standard protocol following bolus administration of intravenous contrast. RADIATION DOSE REDUCTION: This exam was performed according to the departmental dose-optimization program which includes automated exposure control, adjustment of the mA and/or kV according to patient size and/or use of iterative reconstruction technique. CONTRAST:  100mL OMNIPAQUE IOHEXOL 300 MG/ML  SOLN COMPARISON:  None Available. FINDINGS: Lower chest: The visualized lung bases are clear. No intra-abdominal free air or free fluid. Hepatobiliary: Multiple small liver cysts. No biliary dilatation. The gallbladder is unremarkable. Pancreas: Unremarkable. No pancreatic ductal dilatation or surrounding inflammatory changes. Spleen: Normal in size without focal abnormality. Adrenals/Urinary Tract: The adrenal glands are unremarkable. There is no hydronephrosis on either side. There is symmetric enhancement and excretion of contrast by both kidneys. The visualized ureters and urinary bladder appear unremarkable. Stomach/Bowel: There is diverticulosis of the descending and sigmoid colon. There is focal area of active inflammatory change involving a diverticula in the distal descending colon in the left lower quadrant consistent with acute diverticulitis. No abscess or perforation. There is no bowel obstruction. The appendix is normal. Vascular/Lymphatic: Moderate aortoiliac atherosclerotic disease. The IVC is unremarkable. No portal venous gas. There is no adenopathy. Reproductive: The uterus is  grossly unremarkable. No suspicious adnexal masses. Other: None Musculoskeletal: Degenerative changes of the spine. No acute osseous pathology. IMPRESSION: 1. Acute diverticulitis of the distal descending colon. No abscess or perforation. 2.  Aortic Atherosclerosis (ICD10-I70.0). Electronically Signed   By: Vanetta Chou M.D.   On: 11/02/2023 11:14     Procedures   Medications Ordered in the ED  iohexol (OMNIPAQUE) 300 MG/ML solution 100 mL (100 mLs Intravenous Contrast Given 11/02/23 0958)  amoxicillin-clavulanate (AUGMENTIN) 875-125 MG per tablet 1 tablet (1 tablet Oral Given 11/02/23 1359)                                    Medical Decision Making Amount and/or Complexity of Data Reviewed Labs: ordered. Radiology: ordered.  Risk Prescription drug management.   Wendy Crosby is a 78 y.o. female with a past medical history significant for hypertension who presents from PCP for evaluation of left lower quadrant abdominal pain.  According to patient, for the last several days she has had some smoldering but worsening  left lower quadrant abdominal pain.  This is new.  She has not had this before.  No history of abdominal surgeries or known history of diverticulitis.  She reports that some dysuria and her urine has been darker but denies history of kidney stones.  Does report a family history of diverticulitis and kidney stones.  Denies any back or flank pain otherwise.  Denies nausea, vomiting, fevers, chills, congestion, or cough.  She denies any constipation but has had some mild diarrhea without bleeding.  Denies any pain in her legs or swelling.  No other complaints reported.  She reports the pain is a 5-7 out of 10 in severity but is currently slightly improved after some Tylenol.  She reports she still passing gas but noticed a bulge in her left abdomen and went to her PCP and sent her here for CT scan.  She has no history of hernias and denies any recent heavy lifting.  On exam,  lungs clear.  Chest nontender.  Abdomen is tender in the left lower quadrant.  I did not palpate a hernia on my exam with a chaperone.  Flanks and CVA areas and back nontender.  No rashes or shingles.  I did hear some bowel sounds.  Rest of exam markable.  Patient will get screening labs, urinalysis given the urinary symptoms, and will get a CT scan to look for etiology of symptoms.  Anticipate reassessment after workup to determine disposition.  She reports she does not want pain medicine at this time but we will keep her n.p.o. in case there is a surgical problem.      1:34 PM CT returned showing acute diverticulitis without perforation or abscess.  No obstruction seen.  Labs showed normal lactic acid and urinalysis showed no nitrites.  Upper abdomen was nontender and T. bili was slightly elevated.  Platelets slightly low but otherwise labs reassuring.  Given patient's well appearance and reassuring vital signs, will plan for outpatient management of acute diverticulitis.  Will give a dose of antibiotics here and p.o. challenge.  If she passes, will discharge home with prescription for antibiotics, pain medicine, and nauseous, and close PCP follow-up.  Anticipate discharge after p.o. challenge.      Final diagnoses:  LLQ abdominal pain  Dysuria  Diarrhea, unspecified type  Diverticulitis    ED Discharge Orders          Ordered    amoxicillin-clavulanate (AUGMENTIN) 875-125 MG tablet  2 times daily        11/02/23 1339    HYDROcodone-acetaminophen (NORCO/VICODIN) 5-325 MG tablet  Every 4 hours PRN        11/02/23 1339    ondansetron (ZOFRAN-ODT) 4 MG disintegrating tablet  Every 8 hours PRN        11/02/23 1339            Clinical Impression: 1. Diverticulitis   2. LLQ abdominal pain   3. Dysuria   4. Diarrhea, unspecified type     Disposition: Discharge  Condition: Good  I have discussed the results, Dx and Tx plan with the pt(& family if present). He/she/they  expressed understanding and agree(s) with the plan. Discharge instructions discussed at great length. Strict return precautions discussed and pt &/or family have verbalized understanding of the instructions. No further questions at time of discharge.    Discharge Medication List as of 11/02/2023  2:40 PM     START taking these medications   Details  amoxicillin-clavulanate (AUGMENTIN) 875-125 MG tablet Take 1  tablet by mouth 2 (two) times daily for 10 days., Starting Tue 11/02/2023, Until Fri 11/12/2023, Normal    HYDROcodone-acetaminophen (NORCO/VICODIN) 5-325 MG tablet Take 1 tablet by mouth every 4 (four) hours as needed., Starting Tue 11/02/2023, Normal    ondansetron (ZOFRAN-ODT) 4 MG disintegrating tablet Take 1 tablet (4 mg total) by mouth every 8 (eight) hours as needed for nausea or vomiting., Starting Tue 11/02/2023, Normal        Follow Up: Cleotilde Planas, MD [address] [place] [address] [phone]     [facility] Emergency Department at [facility] [address] [place]  [address] [phone]       Teesha Ohm, Lonni PARAS, MD 11/02/23 1549

## 2023-11-02 NOTE — ED Triage Notes (Signed)
 Pt sent by PCP for LLQ abd pain that began yesterday morning. Pt reported taking Tylenol with no relief. Pt reports diarrhea, denies vomiting and stated she has had dark urine.

## 2023-11-02 NOTE — ED Notes (Signed)
Pt instructed to use call bell when able to provide urine specimen.

## 2023-11-02 NOTE — Discharge Instructions (Signed)
 Your history, exam, and workup today revealed acute diverticulitis as a likely cause of your left lower quadrant abdominal pain.  On the CT scan there was no obstruction, perforation, or abscess.  Please take the antibiotics to help treat this and take the pain medicine nausea medicine up with symptoms.  Please rest and stay hydrated.  Please follow-up with your primary doctor.  If any symptoms change or worsen acutely, please return to the nearest emergency department.

## 2023-11-03 LAB — URINE CULTURE: Culture: NO GROWTH

## 2024-01-20 DIAGNOSIS — Z1231 Encounter for screening mammogram for malignant neoplasm of breast: Secondary | ICD-10-CM | POA: Diagnosis not present

## 2024-01-27 DIAGNOSIS — L719 Rosacea, unspecified: Secondary | ICD-10-CM | POA: Diagnosis not present

## 2024-01-27 DIAGNOSIS — E7800 Pure hypercholesterolemia, unspecified: Secondary | ICD-10-CM | POA: Diagnosis not present

## 2024-01-27 DIAGNOSIS — Z6825 Body mass index (BMI) 25.0-25.9, adult: Secondary | ICD-10-CM | POA: Diagnosis not present

## 2024-01-27 DIAGNOSIS — M810 Age-related osteoporosis without current pathological fracture: Secondary | ICD-10-CM | POA: Diagnosis not present

## 2024-01-27 DIAGNOSIS — I10 Essential (primary) hypertension: Secondary | ICD-10-CM | POA: Diagnosis not present

## 2024-02-07 ENCOUNTER — Other Ambulatory Visit: Payer: Self-pay | Admitting: Medical Genetics

## 2024-02-16 ENCOUNTER — Other Ambulatory Visit (HOSPITAL_COMMUNITY)
Admission: RE | Admit: 2024-02-16 | Discharge: 2024-02-16 | Disposition: A | Discharge status: Home / Self Care | Payer: Self-pay | Source: Ambulatory Visit | Attending: Medical Genetics | Admitting: Medical Genetics

## 2024-02-26 LAB — GENECONNECT MOLECULAR SCREEN: Genetic Analysis Overall Interpretation: NEGATIVE

## 2024-02-28 DIAGNOSIS — H5709 Other anomalies of pupillary function: Secondary | ICD-10-CM | POA: Diagnosis not present

## 2024-02-28 DIAGNOSIS — Z961 Presence of intraocular lens: Secondary | ICD-10-CM | POA: Diagnosis not present

## 2024-02-28 DIAGNOSIS — H524 Presbyopia: Secondary | ICD-10-CM | POA: Diagnosis not present
